# Patient Record
Sex: Male | Born: 1985 | Race: Asian | Hispanic: No | Marital: Single | Smoking: Current some day smoker
Health system: Southern US, Community
[De-identification: ages and names within clinical notes are randomized; demographics above are authoritative.]

---

## 2017-02-01 ENCOUNTER — Emergency Department (HOSPITAL_BASED_OUTPATIENT_CLINIC_OR_DEPARTMENT_OTHER): Payer: Self-pay

## 2017-02-01 ENCOUNTER — Encounter (HOSPITAL_BASED_OUTPATIENT_CLINIC_OR_DEPARTMENT_OTHER): Payer: Self-pay | Admitting: Emergency Medicine

## 2017-02-01 ENCOUNTER — Emergency Department (HOSPITAL_BASED_OUTPATIENT_CLINIC_OR_DEPARTMENT_OTHER)
Admission: EM | Admit: 2017-02-01 | Discharge: 2017-02-01 | Disposition: A | Discharge status: Home / Self Care | Payer: Self-pay | Attending: Emergency Medicine | Admitting: Emergency Medicine

## 2017-02-01 DIAGNOSIS — Y939 Activity, unspecified: Secondary | ICD-10-CM | POA: Insufficient documentation

## 2017-02-01 DIAGNOSIS — S62001A Unspecified fracture of navicular [scaphoid] bone of right wrist, initial encounter for closed fracture: Secondary | ICD-10-CM | POA: Insufficient documentation

## 2017-02-01 DIAGNOSIS — Y999 Unspecified external cause status: Secondary | ICD-10-CM | POA: Insufficient documentation

## 2017-02-01 DIAGNOSIS — Y929 Unspecified place or not applicable: Secondary | ICD-10-CM | POA: Insufficient documentation

## 2017-02-01 DIAGNOSIS — M25531 Pain in right wrist: Secondary | ICD-10-CM

## 2017-02-01 DIAGNOSIS — F17210 Nicotine dependence, cigarettes, uncomplicated: Secondary | ICD-10-CM | POA: Insufficient documentation

## 2017-02-01 DIAGNOSIS — X58XXXA Exposure to other specified factors, initial encounter: Secondary | ICD-10-CM | POA: Insufficient documentation

## 2017-02-01 MED ORDER — IBUPROFEN 800 MG PO TABS
800.0000 mg | ORAL_TABLET | Freq: Once | ORAL | Status: DC
  Filled 2017-02-01: qty 1

## 2017-02-01 MED ORDER — IBUPROFEN 800 MG PO TABS: 800.0000 mg | ORAL_TABLET | Freq: Three times a day (TID) | ORAL | 0 refills | Status: AC | PRN

## 2017-02-01 NOTE — ED Notes (Signed)
Pt became angry when I offered him the ibuprofen.  He asked if we were going to fix his wrist here today.  I explained that he would be referred to an orthopedic doctor, that we do not "fix" these things here.  He said he does not have insurance or money and he could not afford to go to another doctor.  He asked what we would do if someone came in with their leg hanging off... "Would you just let him die?"  Pt said, "So this is how it is in [place]." Pt continued to refuse the Ibuprofen stating, "That won't help.  Fixing it will help."  MD notified of pts change in attitude and anger that he is not going to be "fixed" here today.

## 2017-02-01 NOTE — ED Provider Notes (Addendum)
Emergency Department Provider Note   I have reviewed the triage vital signs and the nursing notes.   HISTORY  Chief Complaint Wrist Pain   HPI Marcus Ortiz is a 31 y.o. male presents to the emergency pertinent for evaluation of right wrist pain for the past month. No injury or other obvious inciting event. Denies any redness, fevers, chills. No pain and swelling in other joints. Denies rash or tick bite. He states his father had something similar in the past which was diagnosed as gout. He has been taking over-the-counter medications occasionally with little to no relief. No numbness or tingling. No radiation of symptoms.   History reviewed. No pertinent past medical history.  There are no active problems to display for this patient.   History reviewed. No pertinent surgical history.    Allergies Patient has no known allergies.  History reviewed. No pertinent family history.  Social History Social History  Substance Use Topics  . Smoking status: Current Some Day Smoker  . Smokeless tobacco: Never Used  . Alcohol use No    Review of Systems  Constitutional: No fever/chills Eyes: No visual changes. ENT: No sore throat. Cardiovascular: Denies chest pain. Respiratory: Denies shortness of breath. Gastrointestinal: No abdominal pain.  No nausea, no vomiting.  No diarrhea.  No constipation. Genitourinary: Negative for dysuria. Musculoskeletal: Negative for back pain. Positive right wrist pain.  Skin: Negative for rash. Neurological: Negative for headaches, focal weakness or numbness.  10-point ROS otherwise negative.  ____________________________________________   PHYSICAL EXAM:  VITAL SIGNS: ED Triage Vitals  Enc Vitals Group     BP 02/01/17 1446 138/89     Pulse Rate 02/01/17 1446 64     Resp 02/01/17 1446 16     Temp 02/01/17 1446 98.3 F (36.8 C)     Temp Source 02/01/17 1446 Oral     SpO2 02/01/17 1446 99 %     Weight 02/01/17 1442 218 lb (98.9 kg)    Height 02/01/17 1442 5\' 10"  (1.778 m)     Pain Score 02/01/17 1441 9   Constitutional: Alert and oriented. Well appearing and in no acute distress. Eyes: Conjunctivae are normal. Head: Atraumatic. Nose: No congestion/rhinnorhea. Mouth/Throat: Mucous membranes are moist.  Neck: No stridor.  Cardiovascular: Normal rate, regular rhythm. Good peripheral circulation. Grossly normal heart sounds.   Respiratory: Normal respiratory effort.  No retractions. Lungs CTAB. Musculoskeletal: No lower extremity tenderness nor edema. Mild right wrist pain and swelling. No warmth or erythema. Limited ROM of the wrist. No finger or elbow involvement.  Neurologic:  Normal speech and language. No gross focal neurologic deficits are appreciated.  Skin:  Skin is warm, dry and intact. No rash noted.  ____________________________________________  RADIOLOGY  Dg Wrist Complete Right  Result Date: 02/01/2017 CLINICAL DATA:  C/o Rt anterior wrist pain and sporadic swelling x 1 month w/no known injury EXAM: RIGHT WRIST - COMPLETE 3+ VIEW COMPARISON:  None. FINDINGS: There is a chronic, ununited fracture across the mid scaphoid. The proximal pole scaphoid and most of the distal pole shows heterogeneous sclerosis suggesting avascular necrosis. There is no acute fracture. The wrist joints are normally spaced and aligned. Soft tissues are unremarkable. IMPRESSION: 1. 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 2. No acute findings.  No other abnormalities. Electronically Signed   By: Amie Portland M.D.   On: 02/01/2017 15:27    ____________________________________________   PROCEDURES  Procedure(s) performed:   Procedures  None ____________________________________________   INITIAL IMPRESSION / ASSESSMENT AND PLAN / ED COURSE  Pertinent labs & imaging results that were available during my care of the patient were reviewed by me  and considered in my medical decision making (see chart for details).  Patient resents to the emergency department for evaluation of right wrist pain and swelling over the past month. There is no evidence on exam to suspect septic joint. No other joints involved. Patient does not recall injury. Plan for plain film and reevaluation.  04:09 PM Spoke with Dr. Merlyn Lot with hand surgery regarding the scaphoid non-union. He advises wrist splinting and anti-inflammatories. He would like to see the patient in office for possible surgical intervention. The patient does not have insurance and verbalizes frustration that we cannot fix this problem today in the emergency department. No indication for emergency treatment or transfer at this time. I provided resources to [facility] for PCP follow up and insurance assistance. Gave Motrin and thumb spica velcro splint for treatment.   At this time, I do not feel there is any life-threatening condition present. I have reviewed and discussed all results (EKG, imaging, lab, urine as appropriate), exam findings with patient. I have reviewed nursing notes and appropriate previous records.  I feel the patient is safe to be discharged home without further emergent workup. Discussed usual and customary return precautions. Patient and family (if present) verbalize understanding and are comfortable with this plan.  Patient will follow-up with their primary care provider. If they do not have a primary care provider, information for follow-up has been provided to them. All questions have been answered.  04:14 PM Patient refusing splint at discharge because it will cost him more money. Discharged with Motrin Rx and follow up instructions.  ____________________________________________  FINAL CLINICAL IMPRESSION(S) / ED DIAGNOSES  Final diagnoses:  Right wrist pain  Closed nondisplaced fracture of scaphoid of right wrist, unspecified portion of scaphoid, initial  encounter     MEDICATIONS GIVEN DURING THIS VISIT:  Medications  ibuprofen (ADVIL,MOTRIN) tablet 800 mg (800 mg Oral Not Given 02/01/17 1544)     NEW OUTPATIENT MEDICATIONS STARTED DURING THIS VISIT:  New Prescriptions   IBUPROFEN (ADVIL,MOTRIN) 800 MG TABLET    Take 1 tablet (800 mg total) by mouth every 8 (eight) hours as needed.      Note:  This document was prepared using Dragon voice recognition software and may include unintentional dictation errors.  Alona Bene, MD Emergency Medicine    Clayton Bosserman, Arlyss Repress, MD 02/01/17 1612    Maia Plan, MD 02/01/17 [phone]

## 2017-02-01 NOTE — ED Notes (Signed)
EMT went to go in room and put the splint on the patients wrist. Patient said " I would like to see the doctor, that splint is going to cost me money I don't want that splint." EMT stated that she was going to get the doctor and verified with the patient if he wanted the splint. The patient stated" No splint I want to see the doctor now. "

## 2017-02-01 NOTE — ED Triage Notes (Signed)
Patient states that he started to have swelling to his right wrist about a month ago. He reports that he now has pain so bad be can not sleep. Reports that his Dad has had the same problem

## 2017-02-01 NOTE — Discharge Instructions (Signed)
You were found to have an old wrist fracture that is causing you pain. You will need to see the hand surgeon listed below. You can use the splint and take motrin as needed for pain. Follow up with the PCP listed who can help with your medical care without insurance and help to get insurance in the near future.

## 2018-08-07 IMAGING — CR DG WRIST COMPLETE 3+V*R*
4 series · 4 of 4 positions shown · non-contrast
Comparison: None.

CLINICAL DATA: C/o Rt anterior wrist pain and sporadic swelling x 1
month w/no known injury

EXAM:
RIGHT WRIST - COMPLETE 3+ VIEW

[x wrist pa right]
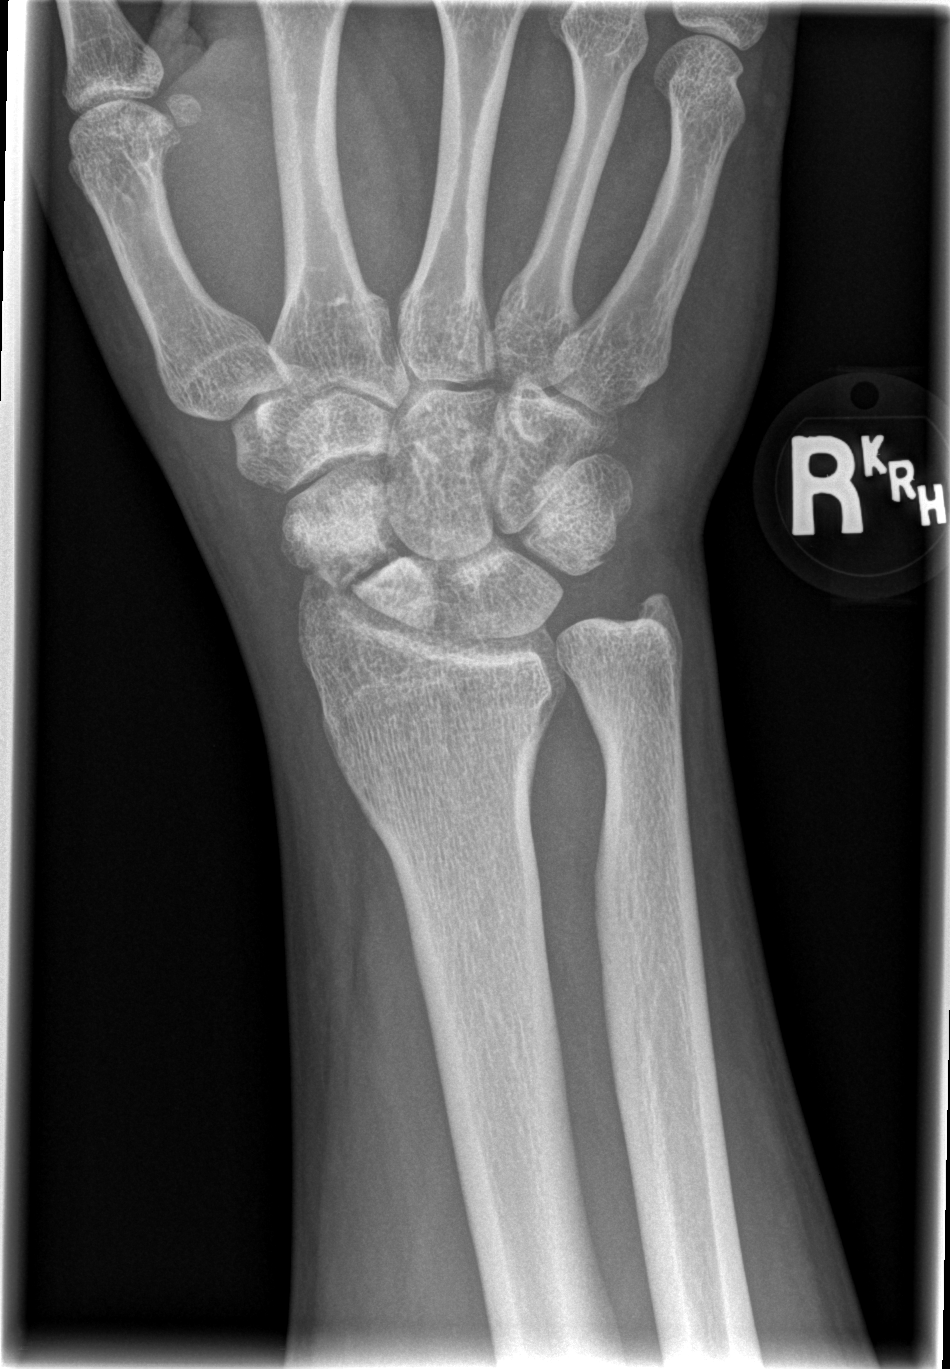

[x wrist obl right]
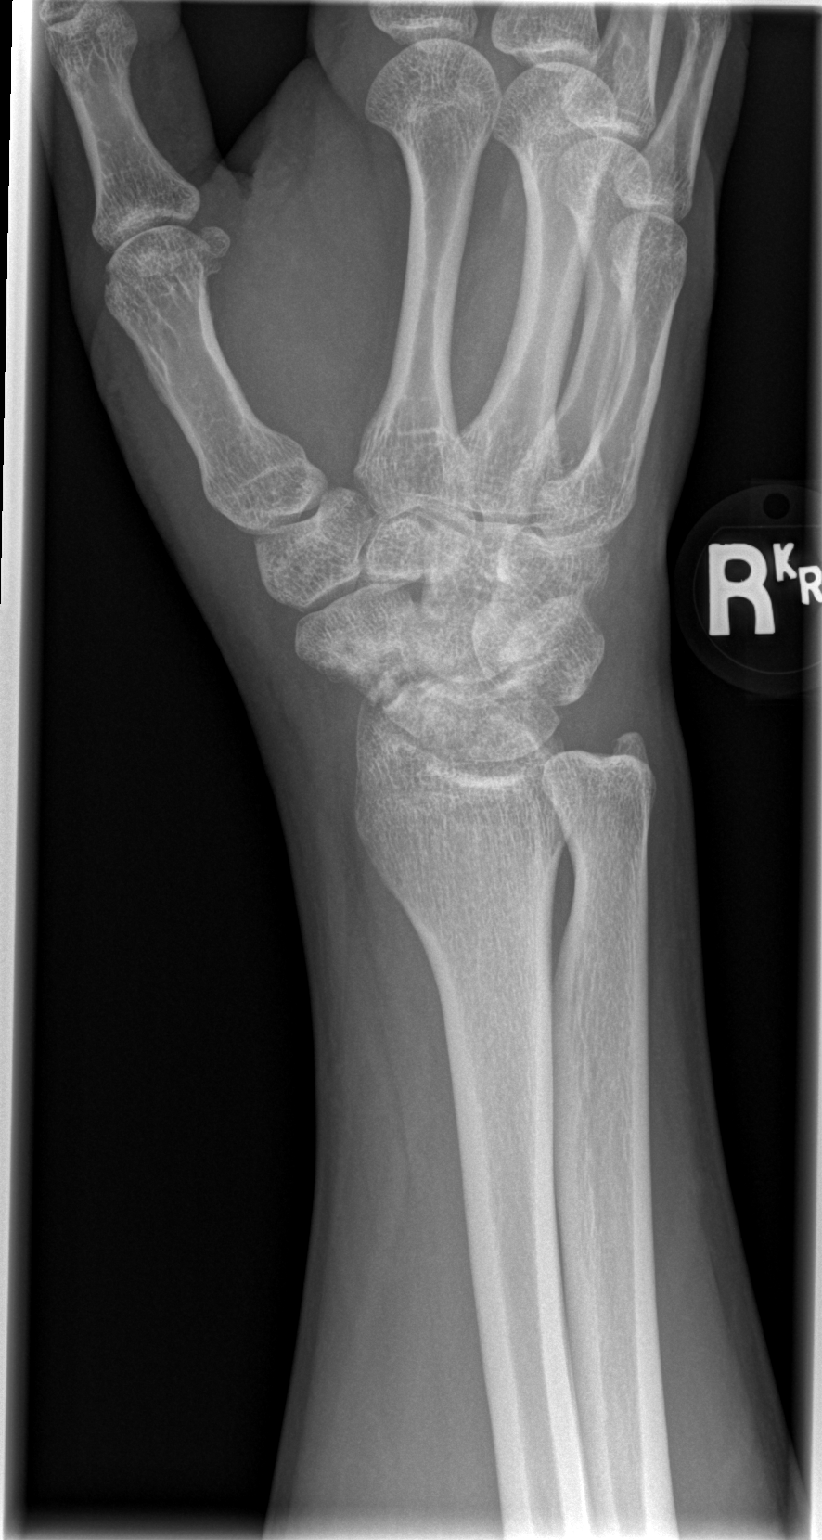

[x wrist lat right]
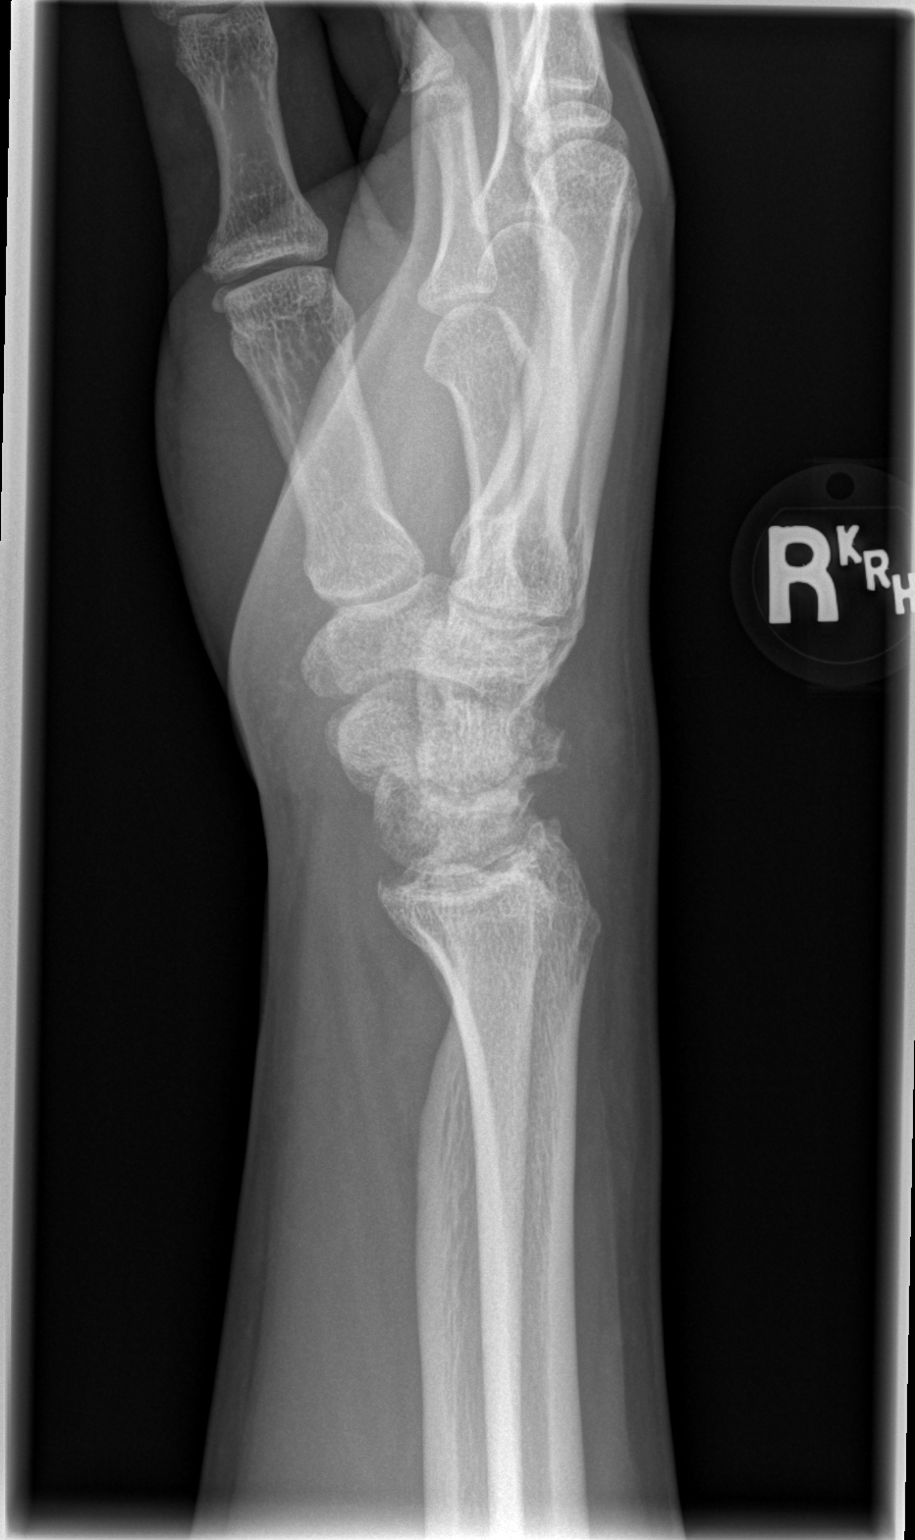

[x navicular]
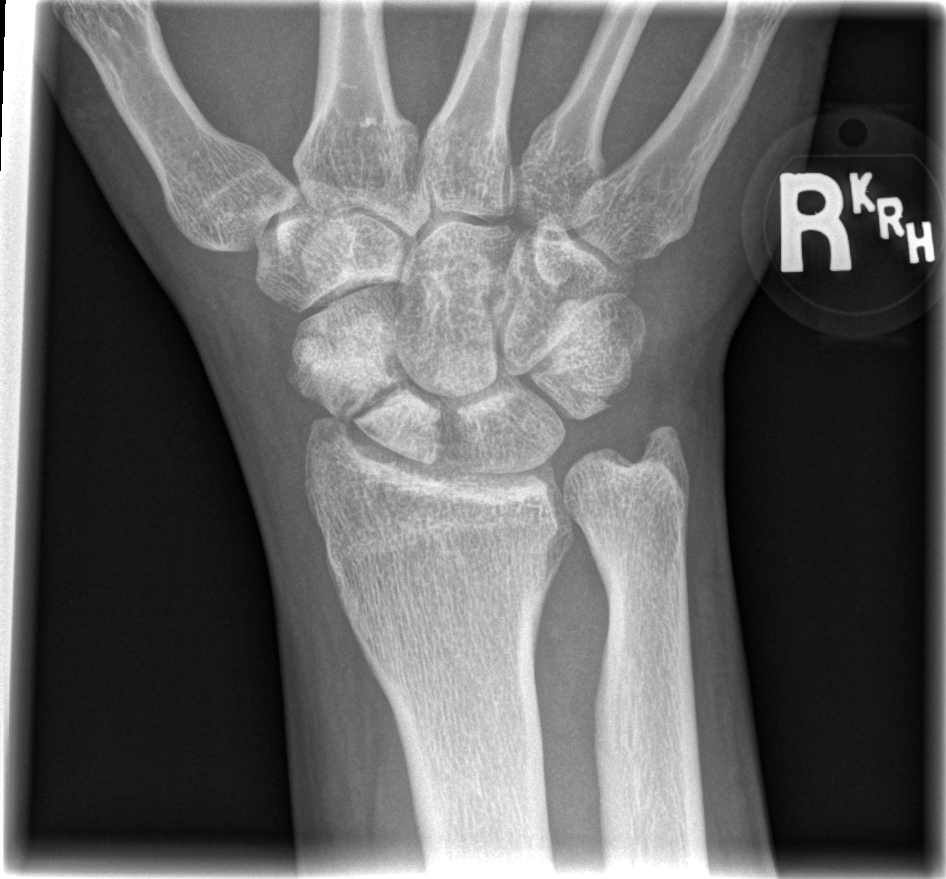

[4 of 4 positions shown; findings below may reference images not displayed]

FINDINGS: There is a chronic, ununited fracture across the mid scaphoid. The
proximal pole scaphoid and most of the distal pole shows
heterogeneous sclerosis suggesting avascular necrosis.

There is no acute fracture. The wrist joints are normally spaced and
aligned.

Soft tissues are unremarkable.
IMPRESSION: 1. Chronic ununited fracture of the mid scaphoid with heterogeneous
scaphoid sclerosis raising suspicion for avascular necrosis.
2. No acute findings.  No other abnormalities.
